# Patient Record
Sex: Female | Born: 1995 | Hispanic: Yes | Marital: Married | Smoking: Never smoker
Health system: Southern US, Community
[De-identification: ages and names within clinical notes are randomized; demographics above are authoritative.]

## PROBLEM LIST (undated history)

## (undated) HISTORY — PX: NEPHRECTOMY: SHX65

## (undated) HISTORY — PX: FOOT SURGERY: SHX648

---

## 2022-02-28 ENCOUNTER — Emergency Department (HOSPITAL_BASED_OUTPATIENT_CLINIC_OR_DEPARTMENT_OTHER): Payer: Self-pay

## 2022-02-28 ENCOUNTER — Encounter (HOSPITAL_BASED_OUTPATIENT_CLINIC_OR_DEPARTMENT_OTHER): Payer: Self-pay | Admitting: Emergency Medicine

## 2022-02-28 ENCOUNTER — Other Ambulatory Visit: Payer: Self-pay

## 2022-02-28 ENCOUNTER — Emergency Department (HOSPITAL_BASED_OUTPATIENT_CLINIC_OR_DEPARTMENT_OTHER)
Admission: EM | Admit: 2022-02-28 | Discharge: 2022-02-28 | Disposition: A | Discharge status: Home / Self Care | Payer: Self-pay | Attending: Emergency Medicine | Admitting: Emergency Medicine

## 2022-02-28 DIAGNOSIS — N9489 Other specified conditions associated with female genital organs and menstrual cycle: Secondary | ICD-10-CM | POA: Insufficient documentation

## 2022-02-28 DIAGNOSIS — Z3A18 18 weeks gestation of pregnancy: Secondary | ICD-10-CM | POA: Insufficient documentation

## 2022-02-28 DIAGNOSIS — N3000 Acute cystitis without hematuria: Secondary | ICD-10-CM

## 2022-02-28 DIAGNOSIS — Z3492 Encounter for supervision of normal pregnancy, unspecified, second trimester: Secondary | ICD-10-CM

## 2022-02-28 DIAGNOSIS — O26892 Other specified pregnancy related conditions, second trimester: Secondary | ICD-10-CM | POA: Insufficient documentation

## 2022-02-28 DIAGNOSIS — R109 Unspecified abdominal pain: Secondary | ICD-10-CM | POA: Insufficient documentation

## 2022-02-28 LAB — URINALYSIS, ROUTINE W REFLEX MICROSCOPIC
Bilirubin Urine: NEGATIVE
Glucose, UA: NEGATIVE mg/dL
Hgb urine dipstick: NEGATIVE
Ketones, ur: NEGATIVE mg/dL
Nitrite: NEGATIVE
Protein, ur: NEGATIVE mg/dL
Specific Gravity, Urine: 1.01 (ref 1.005–1.030)
pH: 6 (ref 5.0–8.0)

## 2022-02-28 LAB — CBC WITH DIFFERENTIAL/PLATELET
Abs Immature Granulocytes: 0.04 10*3/uL (ref 0.00–0.07)
Basophils Absolute: 0.1 10*3/uL (ref 0.0–0.1)
Basophils Relative: 1 %
Eosinophils Absolute: 0.1 10*3/uL (ref 0.0–0.5)
Eosinophils Relative: 1 %
HCT: 35.4 % — ABNORMAL LOW (ref 36.0–46.0)
Hemoglobin: 12.3 g/dL (ref 12.0–15.0)
Immature Granulocytes: 0 %
Lymphocytes Relative: 17 %
Lymphs Abs: 1.8 10*3/uL (ref 0.7–4.0)
MCH: 30.4 pg (ref 26.0–34.0)
MCHC: 34.7 g/dL (ref 30.0–36.0)
MCV: 87.6 fL (ref 80.0–100.0)
Monocytes Absolute: 0.8 10*3/uL (ref 0.1–1.0)
Monocytes Relative: 7 %
Neutro Abs: 7.8 10*3/uL — ABNORMAL HIGH (ref 1.7–7.7)
Neutrophils Relative %: 74 %
Platelets: 224 10*3/uL (ref 150–400)
RBC: 4.04 MIL/uL (ref 3.87–5.11)
RDW: 13.6 % (ref 11.5–15.5)
WBC: 10.5 10*3/uL (ref 4.0–10.5)
nRBC: 0 % (ref 0.0–0.2)

## 2022-02-28 LAB — URINALYSIS, MICROSCOPIC (REFLEX)

## 2022-02-28 LAB — BASIC METABOLIC PANEL
Anion gap: 7 (ref 5–15)
BUN: 12 mg/dL (ref 6–20)
CO2: 23 mmol/L (ref 22–32)
Calcium: 8.8 mg/dL — ABNORMAL LOW (ref 8.9–10.3)
Chloride: 104 mmol/L (ref 98–111)
Creatinine, Ser: 1.02 mg/dL — ABNORMAL HIGH (ref 0.44–1.00)
GFR, Estimated: >60 mL/min (ref >60)
Glucose, Bld: 81 mg/dL (ref 70–99)
Potassium: 3.5 mmol/L (ref 3.5–5.1)
Sodium: 134 mmol/L — ABNORMAL LOW (ref 135–145)

## 2022-02-28 LAB — HCG, QUANTITATIVE, PREGNANCY: hCG, Beta Chain, Quant, S: 16995 m[IU]/mL — ABNORMAL HIGH (ref <5)

## 2022-02-28 MED ORDER — CEPHALEXIN 500 MG PO CAPS: 500.0000 mg | ORAL_CAPSULE | Freq: Three times a day (TID) | ORAL | 0 refills | Status: DC

## 2022-02-28 MED ORDER — CEPHALEXIN 250 MG PO CAPS
500.0000 mg | ORAL_CAPSULE | Freq: Once | ORAL | Status: AC
  Administered 2022-02-28: 500 mg via ORAL
  Filled 2022-02-28: qty 2

## 2022-02-28 MED ORDER — SODIUM CHLORIDE 0.9 % IV BOLUS
1000.0000 mL | Freq: Once | INTRAVENOUS | Status: AC
  Administered 2022-02-28: 1000 mL via INTRAVENOUS

## 2022-02-28 MED ORDER — CEPHALEXIN 500 MG PO CAPS: 500.0000 mg | ORAL_CAPSULE | Freq: Three times a day (TID) | ORAL | 0 refills | Status: AC

## 2022-02-28 NOTE — ED Provider Notes (Signed)
[facility] Provider Note   CSN: [id number] Arrival date & time: 02/28/22  1826     History  Chief Complaint  Patient presents with   Hematuria    Sabrina Yates is a 26 y.o. female.  Patient is a 26 year old female approximately [redacted] weeks pregnant presenting for complaints of abdominal pain.  Patient admits to abdominal cramping and dark concentrated urine.  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  Admits to urinary hesitancy and increased frequency.  Denies any dysuria.  Has not yet felt fetal movement.  The history is provided by the patient. No language interpreter was used.  Hematuria Associated symptoms include abdominal pain. Pertinent negatives include no chest pain and no shortness of breath.      Home Medications Prior to Admission medications   Not on File      Allergies    Loratadine and Morphine    Review of Systems   Review of Systems  Constitutional:  Negative for chills and fever.  HENT:  Negative for ear pain and sore throat.   Eyes:  Negative for pain and visual disturbance.  Respiratory:  Negative for cough and shortness of breath.   Cardiovascular:  Negative for chest pain and palpitations.  Gastrointestinal:  Positive for abdominal pain. Negative for vomiting.  Genitourinary:  Positive for hematuria and urgency. Negative for decreased urine volume and dysuria.  Musculoskeletal:  Negative for arthralgias and back pain.  Skin:  Negative for color change and rash.  Neurological:  Negative for seizures and syncope.  All other systems reviewed and are negative.  Physical Exam Updated Vital Signs BP 117/63 (BP Location: Right Arm)   Pulse 73   Temp 98.2 F (36.8 C) (Oral)   Resp 18   SpO2 100%  Physical Exam Vitals and nursing note reviewed.  Constitutional:      General: She is not in acute distress.    Appearance: She is well-developed.  HENT:     Head: Normocephalic and atraumatic.   Eyes:     Conjunctiva/sclera: Conjunctivae normal.  Cardiovascular:     Rate and Rhythm: Normal rate and regular rhythm.     Heart sounds: No murmur heard. Pulmonary:     Effort: Pulmonary effort is normal. No respiratory distress.     Breath sounds: Normal breath sounds.  Abdominal:     Palpations: Abdomen is soft.     Tenderness: There is no abdominal tenderness.  Musculoskeletal:        General: No swelling.     Cervical back: Neck supple.  Skin:    General: Skin is warm and dry.     Capillary Refill: Capillary refill takes less than 2 seconds.  Neurological:     Mental Status: She is alert.  Psychiatric:        Mood and Affect: Mood normal.    ED Results / Procedures / Treatments   Labs (all labs ordered are listed, but only abnormal results are displayed) Labs Reviewed  CBC WITH DIFFERENTIAL/PLATELET - Abnormal; Notable for the following components:      Result Value   HCT 35.4 (*)    Neutro Abs 7.8 (*)    All other components within normal limits  URINALYSIS, ROUTINE W REFLEX MICROSCOPIC - Abnormal; Notable for the following components:   Leukocytes,Ua MODERATE (*)    All other components within normal limits  URINALYSIS, MICROSCOPIC (REFLEX) - Abnormal; Notable for the following components:   Bacteria, UA MANY (*)  All other components within normal limits  URINE CULTURE  BASIC METABOLIC PANEL  HCG, QUANTITATIVE, PREGNANCY  RH IG WORKUP (INCLUDES ABO/RH)    EKG None  Radiology No results found.  Procedures Procedures    Medications Ordered in ED Medications  sodium chloride 0.9 % bolus 1,000 mL (has no administration in time range)  cephALEXin (KEFLEX) capsule 500 mg (has no administration in time range)    ED Course/ Medical Decision Making/ A&P                           Medical Decision Making Amount and/or Complexity of Data Reviewed Radiology: ordered.  Risk Prescription drug management.   45:33 PM 26 year old female approximately  [redacted] weeks pregnant presenting for complaints of abdominal cramping, increased Neri hesitancy, and dark urine.  Patient is alert and oriented x3, no acute distress, afebrile, stable vital signs.  On exam abdomen is soft and nontender.  Urine present at bedside and appears concentrated.  No urinary tract infection on urinalysis.  Culture sent.  Keflex given in ED.  Patient given IV fluids.  No hematuria.  Abdominal ultrasound demonstrate: Single, viable intrauterine pregnancy at approximately 18 weeks and 0 days gestation by ultrasound evaluation.  No subchorionic hemorrhage.  Anterior placenta.  Patient resting comfortably in the bed and admits to improvement of symptoms after IV fluids.  Abdominal cramping likely secondary to dehydration versus urinary tract infection.  Keflex sent to pharmacy.    Patient in no distress and overall condition improved here in the ED. Detailed discussions were had with the patient regarding current findings, and need for close f/u with OB/GYN. The patient has been instructed to return immediately if the symptoms worsen in any way for re-evaluation. Patient verbalized understanding and is in agreement with current care plan. All questions answered prior to discharge.           Final Clinical Impression(s) / ED Diagnoses Final diagnoses:  None    Rx / DC Orders ED Discharge Orders     None         Lianne Cure, DO [id number]

## 2022-02-28 NOTE — ED Triage Notes (Signed)
Pt reports RT "side" pain and hematuria x 1 wk; she is [redacted] wks pregnant; she is taking baby ASA daily at the direction of her doctor

## 2022-03-02 LAB — URINE CULTURE: Culture: 10000 — AB

## 2022-03-03 ENCOUNTER — Telehealth: Payer: Self-pay

## 2022-03-03 NOTE — Progress Notes (Signed)
ED Antimicrobial Stewardship Positive Culture Follow Up   S[facility]rina Yates is an 26 y.o. female who presented to [facility] on 02/28/2022 with a chief complaint of  Chief Complaint  Patient presents with   Urinary Frequency    Recent Results (from the past 720 hour(s))  Urine Culture     Status: Abnormal   Collection Time: 02/28/22  6:50 PM   Specimen: Urine, Random  Result Value Ref Range Status   Specimen Description   Final    URINE, RANDOM Performed at [facility], [address] [place], [place] [address]    Special Requests   Final    NONE Performed at [facility], [address] [place], [place] [address]    Culture 10,000 COLONIES/mL STAPHYLOCOCCUS EPIDERMIDIS (A)  Final   Report Status 03/02/2022 FINAL  Final   Organism ID, Bacteria STAPHYLOCOCCUS EPIDERMIDIS (A)  Final      Susceptibility   Staphylococcus epidermidis - MIC*    CIPROFLOXACIN <=0.5 SENSITIVE Sensitive     GENTAMICIN <=0.5 SENSITIVE Sensitive     NITROFURANTOIN <=16 SENSITIVE Sensitive     OXACILLIN >=4 RESISTANT Resistant     TETRACYCLINE >=16 RESISTANT Resistant     VANCOMYCIN 1 SENSITIVE Sensitive     TRIMETH/SULFA 80 RESISTANT Resistant     CLINDAMYCIN >=8 RESISTANT Resistant     RIFAMPIN <=0.5 SENSITIVE Sensitive     Inducible Clindamycin NEGATIVE Sensitive     * 10,000 COLONIES/mL STAPHYLOCOCCUS EPIDERMIDIS    [x]  Treated with cephalexin, organism resistant to prescribed antimicrobial  New antibiotic prescription: Nitrofurantoin 100mg  po BID x 7 days  ED Provider: Sherrill Raring, PA   Kaleen Mask 03/03/2022, 1:53 PM Clinical Pharmacist Monday - Friday phone -  [phone] Saturday - Sunday phone - [phone]

## 2022-03-03 NOTE — Telephone Encounter (Signed)
Post ED Visit - Positive Culture Follow-up: Successful Patient Follow-Up  Culture assessed and recommendations reviewed by:  [x]  , Pharm.D. []  Wilburn Cornelia, Pharm.D., BCPS AQ-ID []  , Pharm.D., BCPS []  Celedonio Miyamoto, Pharm.D., BCPS []  [place], Garvin Fila.D., BCPS, AAHIVP []  , Pharm.D., BCPS, AAHIVP []  Georgina Pillion, PharmD, BCPS []  , PharmD, BCPS []  [place], PharmD, BCPS []  [address], PharmD  Positive Urine culture  []  Patient discharged without antimicrobial prescription and treatment is now indicated [x]  Organism is resistant to prescribed ED discharge antimicrobial []  Patient with positive blood cultures  Changes discussed with ED provider: , PA New antibiotic prescription Nitrofurantoin 100 mg po BID x 7 days Called to [facility]  at [address], [place], [place]  via phone number [phone]  Contacted patient, date 03/03/2022, time 3:00 pm   Verlan Friends 03/03/2022, 3:00 PM

## 2023-04-08 IMAGING — US US OB LIMITED
1 series · 14 of 28 positions shown · non-contrast
Comparison: None Available.

CLINICAL DATA: Suprapubic abdominal pain.

EXAM:
LIMITED OBSTETRIC ULTRASOUND

[Series 1: us ob limited · 37 acquisitions, 14 frames shown]
[im 2/37]
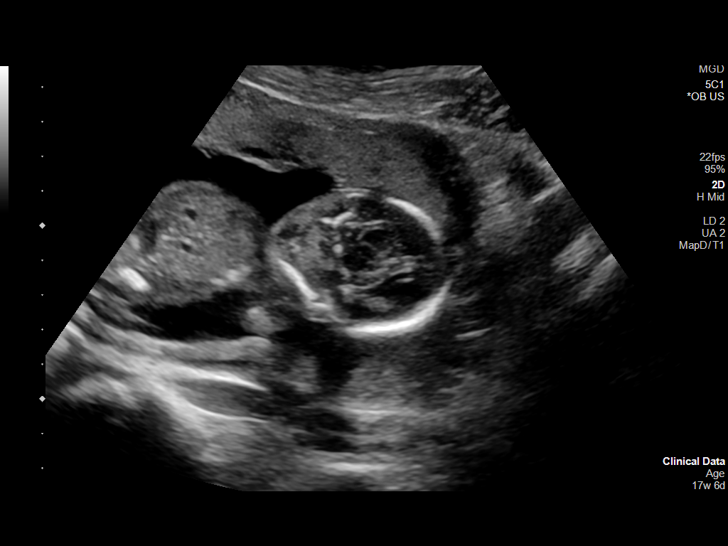
[im 5/37]
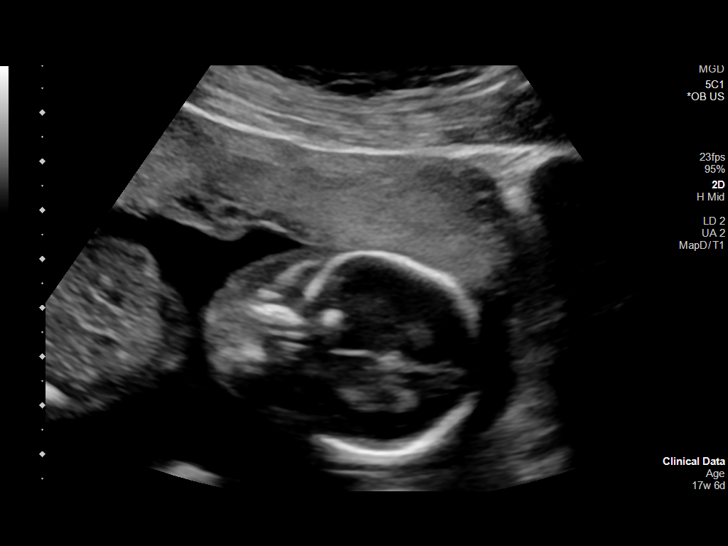
[im 7/37]
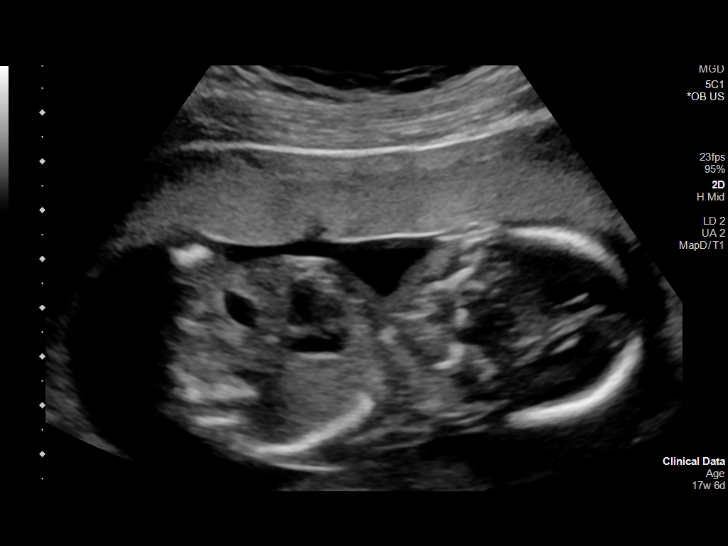
[im 10/37]
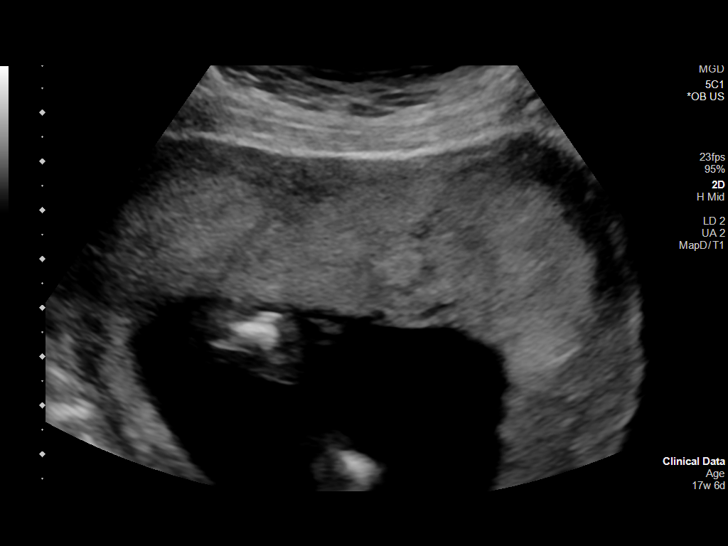
[im 13/37]
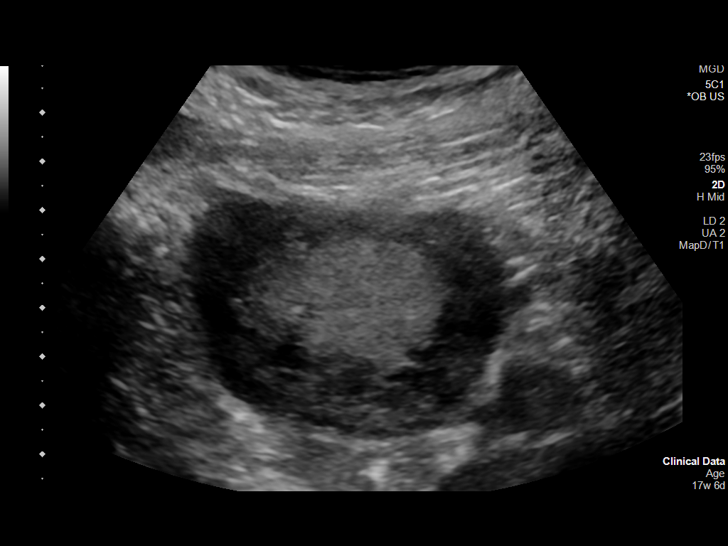
[im 15/37]
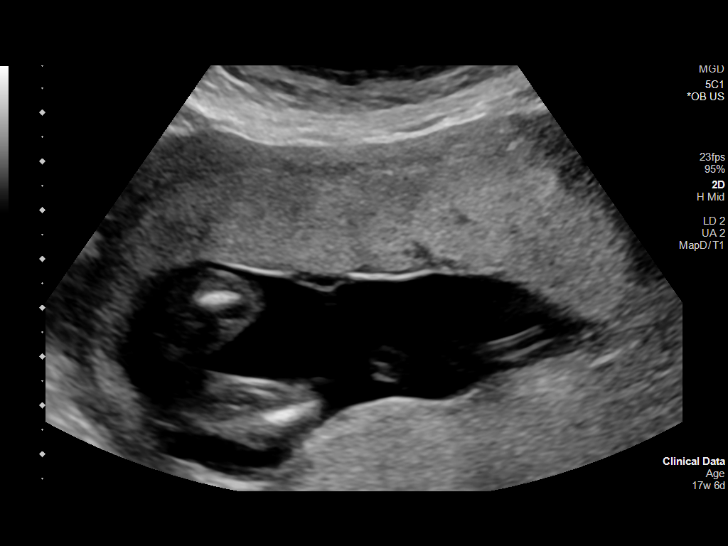
[im 18/37]
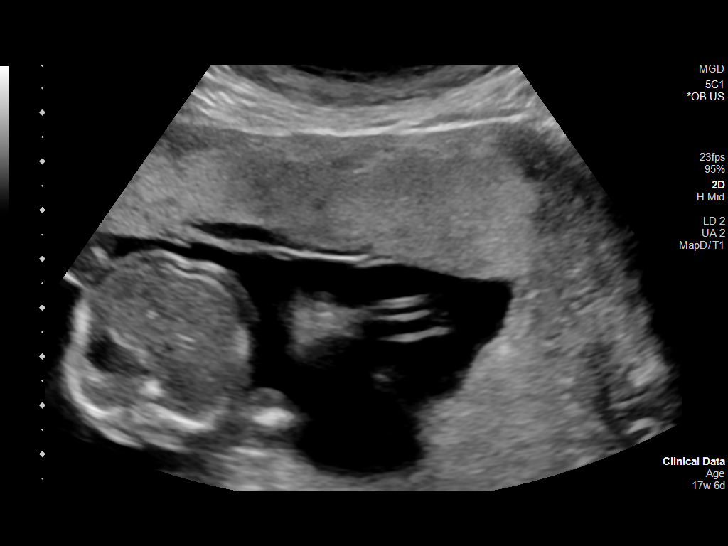
[im 21/37]
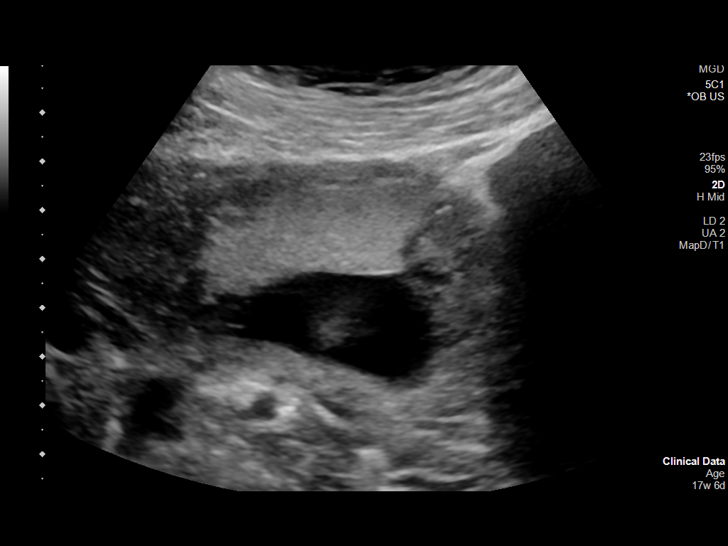
[im 23/37]
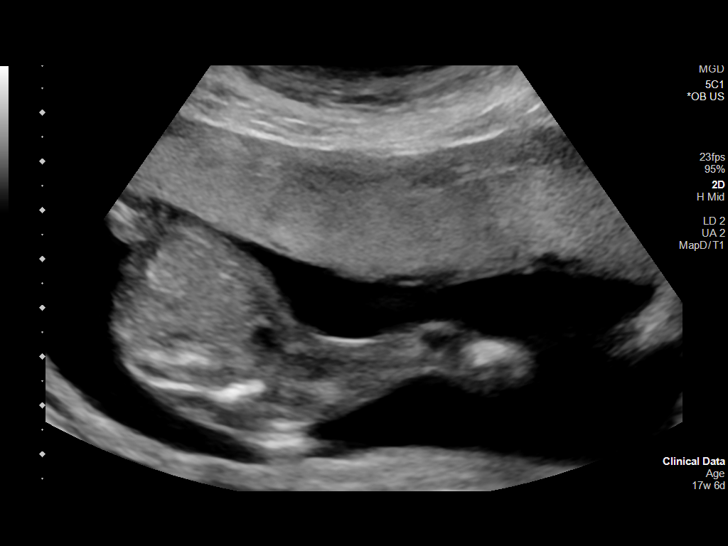
[im 26/37]
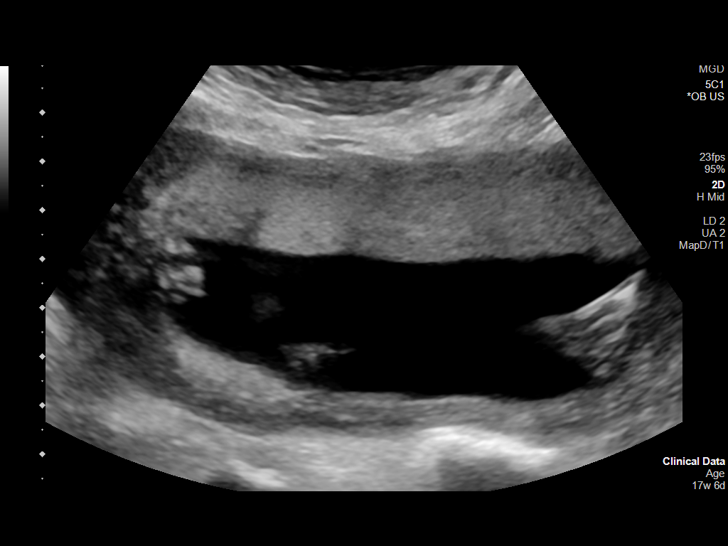
[im 29/37]
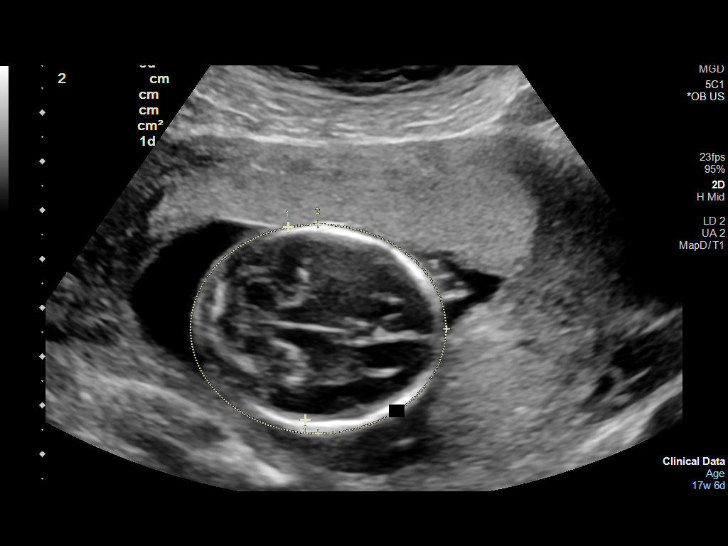
[im 31/37]
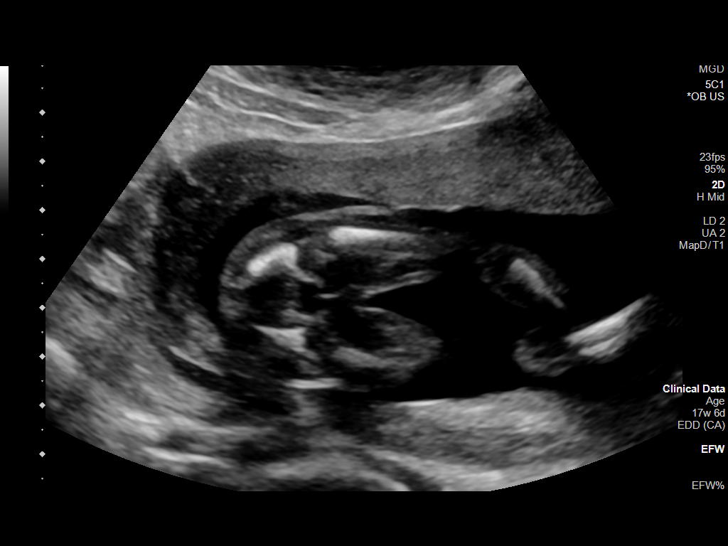
[im 34/37]
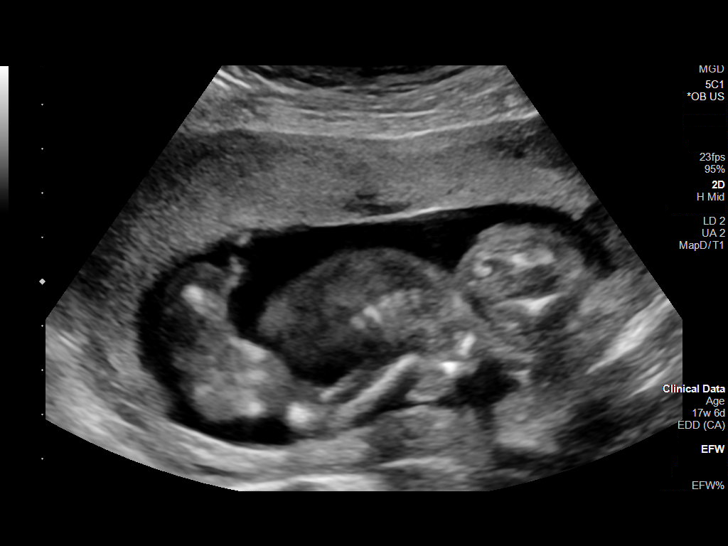
[im 37/37]
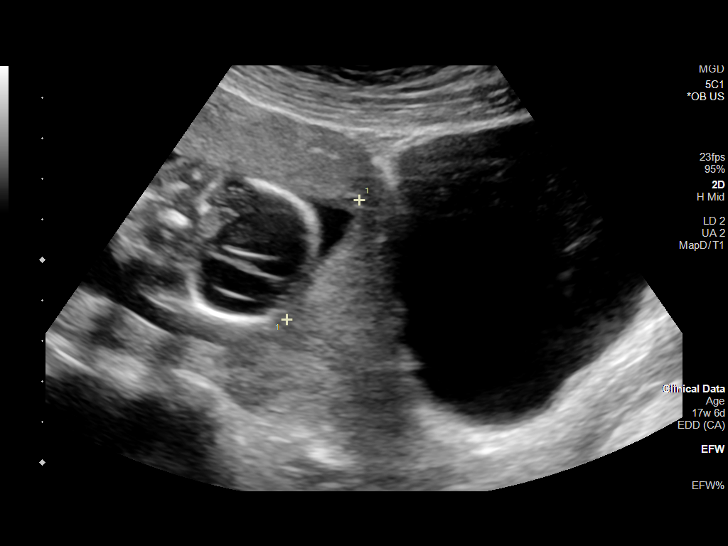

[14 of 28 positions shown; findings below may reference images not displayed]

FINDINGS: Number of Fetuses: 1

Heart Rate:  147 bpm

Movement: Yes

Presentation: Cephalic

Placental Location: Anterior

Previa: No

Amniotic Fluid (Subjective):  Within normal limits.

AFI: N/A cm

BPD: 3.9 cm 18 w  0 d

MATERNAL FINDINGS:

Cervix:  Appears closed.

Uterus/Adnexae: No abnormality visualized.
IMPRESSION: Single, viable intrauterine pregnancy at approximately 18 weeks and
0 days gestation by ultrasound evaluation.

This exam is performed on an emergent basis and does not
comprehensively evaluate fetal size, dating, or anatomy; follow-up
complete OB US should be considered if further fetal assessment is
warranted.
